# Patient Record
Sex: Male | Born: 2000 | Hispanic: Yes | Marital: Single | Smoking: Never smoker
Health system: Southern US, Community
[De-identification: ages and names within clinical notes are randomized; demographics above are authoritative.]

---
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
## 2019-01-24 ENCOUNTER — Emergency Department (HOSPITAL_COMMUNITY)
Admission: EM | Admit: 2019-01-24 | Discharge: 2019-01-24 | Disposition: A | Discharge status: Home / Self Care | Payer: Self-pay | Attending: Emergency Medicine | Admitting: Emergency Medicine

## 2019-01-24 ENCOUNTER — Encounter (HOSPITAL_COMMUNITY): Payer: Self-pay | Admitting: *Deleted

## 2019-01-24 ENCOUNTER — Emergency Department (HOSPITAL_COMMUNITY): Payer: Self-pay

## 2019-01-24 ENCOUNTER — Other Ambulatory Visit: Payer: Self-pay

## 2019-01-24 DIAGNOSIS — Y999 Unspecified external cause status: Secondary | ICD-10-CM | POA: Insufficient documentation

## 2019-01-24 DIAGNOSIS — Y9351 Activity, roller skating (inline) and skateboarding: Secondary | ICD-10-CM | POA: Insufficient documentation

## 2019-01-24 DIAGNOSIS — S62521A Displaced fracture of distal phalanx of right thumb, initial encounter for closed fracture: Secondary | ICD-10-CM | POA: Insufficient documentation

## 2019-01-24 DIAGNOSIS — S60111A Contusion of right thumb with damage to nail, initial encounter: Secondary | ICD-10-CM | POA: Insufficient documentation

## 2019-01-24 DIAGNOSIS — Y929 Unspecified place or not applicable: Secondary | ICD-10-CM | POA: Insufficient documentation

## 2019-01-24 MED ORDER — CEPHALEXIN 500 MG PO CAPS: 500.0000 mg | ORAL_CAPSULE | Freq: Two times a day (BID) | ORAL | 0 refills | Status: DC

## 2019-01-24 NOTE — ED Notes (Signed)
Declined W/C at D/C and was escorted to lobby by RN. 

## 2019-01-24 NOTE — Discharge Instructions (Addendum)
Please read attached information. If you experience any new or worsening signs or symptoms please return to the emergency room for evaluation. Please follow-up with your primary care provider or specialist as discussed. Please use medication prescribed only as directed and discontinue taking if you have any concerning signs or symptoms.   °

## 2019-01-24 NOTE — ED Notes (Signed)
Patient transported to X-ray 

## 2019-01-24 NOTE — ED Triage Notes (Signed)
Pt reports he fell last night while skate boarding. Pt's RT thumb is swollen and nail bed is dark in color.

## 2019-01-24 NOTE — ED Provider Notes (Signed)
[facility] Provider Note   CSN: [id number] Arrival date & time: 01/24/19  0940     History   Chief Complaint Chief Complaint  Patient presents with  . Finger Injury    HPI [name] is a 18 y.o. male.  HPI   18 year old right-hand-dominant male presents today with complaints of right thumb pain.  Patient notes he was skateboarding and fell causing immediate pain to the distal phalanx of the right thumb.  He notes bruising along the area and decreased range of motion at the DIP, remainder of thumb and hand atraumatic no other significant injuries noted.  Patient has not had any food or drink today, he notes taking a Tylenol PM last night.    History reviewed. No pertinent past medical history.     There are no active problems to display for this patient.   History reviewed. No pertinent surgical history.      Home Medications    Prior to Admission medications   Medication Sig Start Date End Date Taking? Authorizing Provider  cephALEXin (KEFLEX) 500 MG capsule Take 1 capsule (500 mg total) by mouth 2 (two) times daily. 01/24/19   Lorriane Dehart, Tinnie GensJeffrey, PA-C  ibuprofen (CHILDRENS MOTRIN) 100 MG/5ML suspension Take 20 mLs (400 mg total) by mouth every 6 (six) hours as needed. 12/29/14   Piepenbrink, Victorino DikeJennifer, PA-C  naproxen (NAPROSYN) 500 MG tablet Take 1 tablet (500 mg total) by mouth 2 (two) times daily as needed for mild pain, moderate pain or headache (TAKE WITH MEALS.). 07/15/14   Street, MoroniMercedes, PA-C  oxyCODONE-acetaminophen (PERCOCET) 5-325 MG per tablet Take 1-2 tablets by mouth every 6 (six) hours as needed for severe pain. 07/15/14   Street, BryantownMercedes, PA-C    Family History History reviewed. No pertinent family history.  Social History Social History   Tobacco Use  . Smoking status: Never Smoker  . Smokeless tobacco: Never Used  Substance Use Topics  . Alcohol use: No  . Drug use: No     Allergies     Patient has no known allergies.   Review of Systems Review of Systems  All other systems reviewed and are negative.    Physical Exam Updated Vital Signs BP 124/75 (BP Location: Right Arm)   Pulse (!) 56   Temp 98 F (36.7 C) (Oral)   Resp 16   Ht 5\' 7"  (1.702 m)   Wt 68 kg   SpO2 100%   BMI 23.49 kg/m   Physical Exam Vitals signs and nursing note reviewed.  Constitutional:      Appearance: He is well-developed.  HENT:     Head: Normocephalic and atraumatic.  Eyes:     General: No scleral icterus.       Right eye: No discharge.        Left eye: No discharge.     Conjunctiva/sclera: Conjunctivae normal.     Pupils: Pupils are equal, round, and reactive to light.  Neck:     Musculoskeletal: Normal range of motion.     Vascular: No JVD.     Trachea: No tracheal deviation.  Pulmonary:     Effort: Pulmonary effort is normal.     Breath sounds: No stridor.  Musculoskeletal:     Comments: Bruising noted to the right distal first phalanx with subungual hematoma decreased flexion noted at the DIP remainder of hand nontender atraumatic cap refill intact to the distal thumb  Neurological:     Mental Status: He is  alert and oriented to person, place, and time.     Coordination: Coordination normal.  Psychiatric:        Behavior: Behavior normal.        Thought Content: Thought content normal.        Judgment: Judgment normal.      ED Treatments / Results  Labs (all labs ordered are listed, but only abnormal results are displayed) Labs Reviewed - No data to display  EKG None  Radiology Dg Finger Thumb Right  Result Date: 01/24/2019 CLINICAL DATA:  Trauma to distal phalanx, bruising. EXAM: RIGHT THUMB 2+V COMPARISON:  None. FINDINGS: Slightly displaced fracture at the base of the distal phalanx of the RIGHT thumb, with extension to the articular surface. Alignment at the IP joint remains normal. Proximal phalanx appears intact and normally aligned. IMPRESSION:  Slightly displaced fracture at the base of the distal phalanx of the RIGHT thumb, with extension to the articular surface. Electronically Signed   By: Bary RichardStan  Maynard M.D.   On: 01/24/2019 10:46    Procedures Procedures (including critical care time)  Subungual hematoma drained with cautery pen no complications    Medications Ordered in ED Medications - No data to display   Initial Impression / Assessment and Plan / ED Course  I have reviewed the triage vital signs and the nursing notes.  Pertinent labs & imaging results that were available during my care of the patient were reviewed by me and considered in my medical decision making (see chart for details).     Labs:   Imaging: DG thumb right  Consults: Dr. Mina MarbleWeingold  Therapeutics:  Discharge Meds: Keflex  Assessment/Plan: 18 year old male presents today with fracture of the distal phalanx.  Discussed case with on-call surgeon Dr. Mina MarbleWeingold who recommends splinting with outpatient follow-up.  He also recommends antibiotics after subungual hematoma drainage.   Final Clinical Impressions(s) / ED Diagnoses   Final diagnoses:  Closed displaced fracture of distal phalanx of right thumb, initial encounter    ED Discharge Orders         Ordered    cephALEXin (KEFLEX) 500 MG capsule  2 times daily     01/24/19 1205           Eyvonne MechanicHedges, Enes Rokosz, PA-C 01/24/19 1211    Melene PlanFloyd, Dan, DO 01/24/19 1435

## 2019-03-19 ENCOUNTER — Ambulatory Visit
Admission: EM | Admit: 2019-03-19 | Discharge: 2019-03-19 | Disposition: A | Discharge status: Home / Self Care | Payer: Medicaid Other | Attending: Physician Assistant | Admitting: Physician Assistant

## 2019-03-19 DIAGNOSIS — H6123 Impacted cerumen, bilateral: Secondary | ICD-10-CM

## 2019-03-19 MED ORDER — FLUTICASONE PROPIONATE 50 MCG/ACT NA SUSP: 2.0000 | Freq: Every day | NASAL | 0 refills | Status: DC

## 2019-03-19 NOTE — Discharge Instructions (Signed)
Ear wax removed today. If experiencing continued muffled hearing, can fill flonase and use as directed for 2 weeks. Otherwise, recheck as needed.

## 2019-03-19 NOTE — ED Triage Notes (Signed)
Pt c/o rt ear fullness since yesterday, denies pain

## 2019-03-19 NOTE — ED Provider Notes (Signed)
[facility]    CSN: [id number] Arrival date & time: 03/19/19  1355     History   Chief Complaint Chief Complaint  Patient presents with  . Ear Fullness    HPI James Shields is a 18 y.o. male.   18 year old male comes in for 2-day history of right ear fullness.  Denies pain, drainage. Denies rhinorrhea, nasal congestion, cough.  Denies fever, chills, night sweats.  Denies recent swimming, foreign body in ear.  States felt fullness, try to use a cotton swab without relief.      History reviewed. No pertinent past medical history.  There are no active problems to display for this patient.   History reviewed. No pertinent surgical history.     Home Medications    Prior to Admission medications   Medication Sig Start Date End Date Taking? Authorizing Provider  fluticasone (FLONASE) 50 MCG/ACT nasal spray Place 2 sprays into both nostrils daily. 03/19/19   Cathie Hoops, Amy V, PA-C  ibuprofen (CHILDRENS MOTRIN) 100 MG/5ML suspension Take 20 mLs (400 mg total) by mouth every 6 (six) hours as needed. 12/29/14   Piepenbrink, Victorino Dike, PA-C  naproxen (NAPROSYN) 500 MG tablet Take 1 tablet (500 mg total) by mouth 2 (two) times daily as needed for mild pain, moderate pain or headache (TAKE WITH MEALS.). 07/15/14   Street, [place], PA-C    Family History No family history on file.  Social History Social History   Tobacco Use  . Smoking status: Never Smoker  . Smokeless tobacco: Never Used  Substance Use Topics  . Alcohol use: No  . Drug use: No     Allergies   Patient has no known allergies.   Review of Systems Review of Systems  Reason unable to perform ROS: See HPI as above.     Physical Exam Triage Vital Signs ED Triage Vitals [03/19/19 1402]  Enc Vitals Group     BP 109/70     Pulse Rate 69     Resp 16     Temp 98 F (36.7 C)     Temp Source Oral     SpO2 97 %     Weight      Height      Head Circumference      Peak Flow      Pain  Score 0     Pain Loc      Pain Edu?      Excl. in GC?    No data found.  Updated Vital Signs BP 109/70 (BP Location: Left Arm)   Pulse 69   Temp 98 F (36.7 C) (Oral)   Resp 16   SpO2 97%   Physical Exam Constitutional:      General: He is not in acute distress.    Appearance: He is well-developed. He is not ill-appearing, toxic-appearing or diaphoretic.  HENT:     Head: Normocephalic and atraumatic.     Ears:     Comments: No tenderness to palpation of bilateral tragus.  Bilateral cerumen impaction, TM not visible.  Post ear irrigation, no ear canal swelling.  Right ear TM opaque and dull.  No erythema, bulging.  Left ear TM normal, pearly gray.    Nose: Nose normal. No congestion or rhinorrhea.     Mouth/Throat:     Mouth: Mucous membranes are moist.     Pharynx: Oropharynx is clear. Uvula midline.  Eyes:     Conjunctiva/sclera: Conjunctivae normal.     Pupils: Pupils are  equal, round, and reactive to light.  Neurological:     Mental Status: He is alert and oriented to person, place, and time.      UC Treatments / Results  Labs (all labs ordered are listed, but only abnormal results are displayed) Labs Reviewed - No data to display  EKG None  Radiology No results found.  Procedures Procedures (including critical care time)  Medications Ordered in UC Medications - No data to display  Initial Impression / Assessment and Plan / UC Course  I have reviewed the triage vital signs and the nursing notes.  Pertinent labs & imaging results that were available during my care of the patient were reviewed by me and considered in my medical decision making (see chart for details).    Patient with significant improvement of symptoms after ear irrigation.  Still has mild muffled hearing to the right ear.  Will provide Flonase for possible eustachian tube dysfunction.  Return precautions given.  Patient expresses understanding and agrees to plan.  Final Clinical  Impressions(s) / UC Diagnoses   Final diagnoses:  Bilateral impacted cerumen    ED Prescriptions    Medication Sig Dispense Auth. Provider   fluticasone (FLONASE) 50 MCG/ACT nasal spray Place 2 sprays into both nostrils daily. 1 g Threasa Alpha, PA-C 03/19/19 1501

## 2019-08-22 ENCOUNTER — Ambulatory Visit
Admission: EM | Admit: 2019-08-22 | Discharge: 2019-08-22 | Disposition: A | Discharge status: Home / Self Care | Payer: Medicaid Other | Attending: Physician Assistant | Admitting: Physician Assistant

## 2019-08-22 ENCOUNTER — Encounter: Payer: Self-pay | Admitting: Emergency Medicine

## 2019-08-22 ENCOUNTER — Other Ambulatory Visit: Payer: Self-pay

## 2019-08-22 DIAGNOSIS — Z202 Contact with and (suspected) exposure to infections with a predominantly sexual mode of transmission: Secondary | ICD-10-CM

## 2019-08-22 MED ORDER — AZITHROMYCIN 500 MG PO TABS
1000.0000 mg | ORAL_TABLET | Freq: Once | ORAL | Status: AC
  Administered 2019-08-22: 1000 mg via ORAL

## 2019-08-22 NOTE — ED Provider Notes (Signed)
[facility]    CSN: [id number] Arrival date & time: 08/22/19  1250      History   Chief Complaint Chief Complaint  Patient presents with  . Exposure to STD    HPI James Shields is a 18 y.o. male.   18 year old male comes in for STD testing due to exposure. He is asymptomatic. Denies urinary symptoms such as frequency, dysuria, hematuria. Denies abdominal pain, nausea, vomiting. Denies penile discharge, penile lesion, testicular swelling, testicular pain. Sexually active with one male partner, no condom use. States partner was tested positive for chlamydia.     History reviewed. No pertinent past medical history.  There are no active problems to display for this patient.   History reviewed. No pertinent surgical history.     Home Medications    Prior to Admission medications   Medication Sig Start Date End Date Taking? Authorizing Provider  fluticasone (FLONASE) 50 MCG/ACT nasal spray Place 2 sprays into both nostrils daily. 03/19/19   Cathie HoopsYu, Amy V, PA-C  ibuprofen (CHILDRENS MOTRIN) 100 MG/5ML suspension Take 20 mLs (400 mg total) by mouth every 6 (six) hours as needed. 12/29/14   Piepenbrink, Victorino DikeJennifer, PA-C  naproxen (NAPROSYN) 500 MG tablet Take 1 tablet (500 mg total) by mouth 2 (two) times daily as needed for mild pain, moderate pain or headache (TAKE WITH MEALS.). 07/15/14   Street, ButlerMercedes, PA-C    Family History History reviewed. No pertinent family history.  Social History Social History   Tobacco Use  . Smoking status: Never Smoker  . Smokeless tobacco: Never Used  Substance Use Topics  . Alcohol use: No  . Drug use: No     Allergies   Patient has no known allergies.   Review of Systems Review of Systems  Reason unable to perform ROS: See HPI as above.     Physical Exam Triage Vital Signs ED Triage Vitals [08/22/19 1309]  Enc Vitals Group     BP 114/63     Pulse Rate 60     Resp 18     Temp 98.3 F (36.8 C)   Temp Source Oral     SpO2 98 %     Weight      Height      Head Circumference      Peak Flow      Pain Score 0     Pain Loc      Pain Edu?      Excl. in GC?    No data found.  Updated Vital Signs BP 114/63 (BP Location: Right Arm)   Pulse 60   Temp 98.3 F (36.8 C) (Oral)   Resp 18   SpO2 98%   Physical Exam Exam conducted with a chaperone present.  Constitutional:      General: He is not in acute distress.    Appearance: He is well-developed. He is not diaphoretic.  HENT:     Head: Normocephalic and atraumatic.  Eyes:     Conjunctiva/sclera: Conjunctivae normal.     Pupils: Pupils are equal, round, and reactive to light.  Pulmonary:     Effort: Pulmonary effort is normal. No respiratory distress.  Genitourinary:    Penis: Uncircumcised. No discharge or lesions.      Scrotum/Testes: Normal.  Neurological:     Mental Status: He is alert and oriented to person, place, and time.     UC Treatments / Results  Labs (all labs ordered are listed, but only abnormal  results are displayed) Labs Reviewed  CYTOLOGY, (ORAL, ANAL, URETHRAL) ANCILLARY ONLY    EKG   Radiology No results found.  Procedures Procedures (including critical care time)  Medications Ordered in UC Medications  azithromycin (ZITHROMAX) tablet 1,000 mg (1,000 mg Oral Given 08/22/19 1317)    Initial Impression / Assessment and Plan / UC Course  I have reviewed the triage vital signs and the nursing notes.  Pertinent labs & imaging results that were available during my care of the patient were reviewed by me and considered in my medical decision making (see chart for details).    Patient was treated empirically for chlamydia. Azithromycin given in office today. Cytology sent, patient will be contacted with any positive results that require additional treatment. Patient to refrain from sexual activity for the next 7 days. Return precautions given.   Final Clinical Impressions(s) / UC Diagnoses    Final diagnoses:  STD exposure   ED Prescriptions    None       Ok Edwards, PA-C 08/22/19 1345

## 2019-08-22 NOTE — ED Triage Notes (Signed)
Pt here for exposure to chlamydia; denies sx

## 2019-08-22 NOTE — Discharge Instructions (Signed)
You were treated empirically for chlamydia. Azithromycin 1g by mouth given in office today. Cytology sent, you will be contacted with any positive results that requires further treatment. Refrain from sexual activity and alcohol use for the next 7 days. Monitor for worsening symptoms, testicular swelling/pain, follow up for reevaluation needed.

## 2019-08-26 LAB — CYTOLOGY, (ORAL, ANAL, URETHRAL) ANCILLARY ONLY
Chlamydia: NEGATIVE
Neisseria Gonorrhea: NEGATIVE
Trichomonas: NEGATIVE

## 2020-06-16 ENCOUNTER — Other Ambulatory Visit: Payer: Self-pay

## 2020-06-16 ENCOUNTER — Encounter: Payer: Self-pay | Admitting: Emergency Medicine

## 2020-06-16 ENCOUNTER — Ambulatory Visit
Admission: EM | Admit: 2020-06-16 | Discharge: 2020-06-16 | Disposition: A | Discharge status: Home / Self Care | Payer: 59 | Attending: Physician Assistant | Admitting: Physician Assistant

## 2020-06-16 DIAGNOSIS — H9202 Otalgia, left ear: Secondary | ICD-10-CM

## 2020-06-16 MED ORDER — FLUTICASONE PROPIONATE 50 MCG/ACT NA SUSP: 2.0000 | Freq: Every day | NASAL | 0 refills | Status: DC

## 2020-06-16 MED ORDER — AZELASTINE HCL 0.1 % NA SOLN: 2.0000 | Freq: Two times a day (BID) | NASAL | 0 refills | Status: DC

## 2020-06-16 NOTE — ED Triage Notes (Addendum)
Woke with left ear pain that has worsened throughout the day.  Painful with chewing or clenching of teeth  Denies runny nose No sore throat No cough

## 2020-06-16 NOTE — Discharge Instructions (Signed)
Flonase as directed. If symptoms not improving after 2-3 days, can add azelastine as directed. Follow up with ear nose and throat if symptoms not improving.

## 2020-06-16 NOTE — ED Provider Notes (Signed)
[facility]    CSN: [id number] Arrival date & time: 06/16/20  1728      History   Chief Complaint Chief Complaint  Patient presents with  . Otalgia    HPI James Shields is a 19 y.o. male.   19 year old male comes in for 1 day history of left ear pain. Getting worse throughout the day. Denies muffled hearing, ear drainage. Denies URI symptoms, fever. Has gone swimming recently.      History reviewed. No pertinent past medical history.  There are no problems to display for this patient.   History reviewed. No pertinent surgical history.     Home Medications    Prior to Admission medications   Medication Sig Start Date End Date Taking? Authorizing Provider  azelastine (ASTELIN) 0.1 % nasal spray Place 2 sprays into both nostrils 2 (two) times daily. 06/16/20   Cathie Hoops, Haywood Meinders V, PA-C  fluticasone (FLONASE) 50 MCG/ACT nasal spray Place 2 sprays into both nostrils daily. 06/16/20   Belinda Fisher, PA-C    Family History Family History  Problem Relation Age of Onset  . Healthy Mother     Social History Social History   Tobacco Use  . Smoking status: Never Smoker  . Smokeless tobacco: Never Used  Vaping Use  . Vaping Use: Never used  Substance Use Topics  . Alcohol use: No  . Drug use: No     Allergies   Patient has no known allergies.   Review of Systems Review of Systems  Reason unable to perform ROS: See HPI as above.     Physical Exam Triage Vital Signs ED Triage Vitals  Enc Vitals Group     BP 06/16/20 1740 (!) 110/56     Pulse Rate 06/16/20 1740 (!) 47     Resp 06/16/20 1740 16     Temp 06/16/20 1740 98.2 F (36.8 C)     Temp Source 06/16/20 1740 Oral     SpO2 06/16/20 1740 99 %     Weight --      Height --      Head Circumference --      Peak Flow --      Pain Score 06/16/20 1747 10     Pain Loc --      Pain Edu? --      Excl. in GC? --    No data found.  Updated Vital Signs BP (!) 110/56 (BP Location: Left Arm)    Pulse (!) 47   Temp 98.2 F (36.8 C) (Oral)   Resp 16   SpO2 99%   Physical Exam Constitutional:      General: He is not in acute distress.    Appearance: Normal appearance. He is well-developed. He is not toxic-appearing or diaphoretic.  HENT:     Head: Normocephalic and atraumatic.     Right Ear: Tympanic membrane, ear canal and external ear normal. Tympanic membrane is not erythematous or bulging.     Left Ear: Tympanic membrane, ear canal and external ear normal. Tympanic membrane is not erythematous or bulging.     Ears:     Comments: No tenderness to bilateral tragus. Eyes:     Conjunctiva/sclera: Conjunctivae normal.     Pupils: Pupils are equal, round, and reactive to light.  Pulmonary:     Effort: Pulmonary effort is normal. No respiratory distress.     Comments: Speaking in full sentences without difficulty Musculoskeletal:     Cervical back: Normal  range of motion and neck supple.  Skin:    General: Skin is warm and dry.  Neurological:     Mental Status: He is alert and oriented to person, place, and time.      UC Treatments / Results  Labs (all labs ordered are listed, but only abnormal results are displayed) Labs Reviewed - No data to display  EKG   Radiology No results found.  Procedures Procedures (including critical care time)  Medications Ordered in UC Medications - No data to display  Initial Impression / Assessment and Plan / UC Course  I have reviewed the triage vital signs and the nursing notes.  Pertinent labs & imaging results that were available during my care of the patient were reviewed by me and considered in my medical decision making (see chart for details).    Ear exam without otitis media/externa. Discussed possible eustachian tube dysfunction. Will start flonase as directed. Add azelastine if symptoms still not improving. Follow up with ENT for further evaluation if needed. Return precautions given.  Final Clinical  Impressions(s) / UC Diagnoses   Final diagnoses:  Left ear pain   ED Prescriptions    Medication Sig Dispense Auth. Provider   fluticasone (FLONASE) 50 MCG/ACT nasal spray Place 2 sprays into both nostrils daily. 1 g Konni Kesinger V, PA-C   azelastine (ASTELIN) 0.1 % nasal spray Place 2 sprays into both nostrils 2 (two) times daily. 30 mL Ok Edwards, PA-C     PDMP not reviewed this encounter.   Ok Edwards, PA-C 06/16/20 1831

## 2020-07-20 ENCOUNTER — Ambulatory Visit (INDEPENDENT_AMBULATORY_CARE_PROVIDER_SITE_OTHER): Payer: 59

## 2020-07-20 ENCOUNTER — Ambulatory Visit
Admission: EM | Admit: 2020-07-20 | Discharge: 2020-07-20 | Disposition: A | Discharge status: Home / Self Care | Payer: 59 | Attending: Family Medicine | Admitting: Family Medicine

## 2020-07-20 DIAGNOSIS — M25532 Pain in left wrist: Secondary | ICD-10-CM

## 2020-07-20 DIAGNOSIS — S6992XA Unspecified injury of left wrist, hand and finger(s), initial encounter: Secondary | ICD-10-CM | POA: Diagnosis not present

## 2020-07-20 DIAGNOSIS — S63502A Unspecified sprain of left wrist, initial encounter: Secondary | ICD-10-CM

## 2020-07-20 NOTE — Discharge Instructions (Addendum)
Take the ibuprofen as prescribed.  Rest and elevate your hand.  Apply ice packs 2-3 times a day for up to 20 minutes each.  Wear the brace when you are active and for comfort. Wear the brace when you are sleeping  Follow up with your primary care provider or an orthopedist if you symptoms continue or worsen;  Or if you develop new symptoms, such as numbness, tingling, or weakness.

## 2020-07-20 NOTE — ED Provider Notes (Signed)
[facility]   [id number] 07/20/20 Arrival Time: 1926  RD:EYCXK PAIN  SUBJECTIVE: History from: patient. Darald Uzzle Kazee is a 19 y.o. male complains of left wrist pain that began about an hour ago.  Reports that he fell off of his skateboard and caught himself with this left hand. Reports pain since then. Has not taken OTC medications for this. Denies fever, chills, erythema, ecchymosis, effusion, weakness, numbness and tingling, saddle paresthesias, loss of bowel or bladder function.      ROS: As per HPI.  All other pertinent ROS negative.     History reviewed. No pertinent past medical history. History reviewed. No pertinent surgical history. No Known Allergies No current facility-administered medications on file prior to encounter.   No current outpatient medications on file prior to encounter.   Social History   Socioeconomic History  . Marital status: Single    Spouse name: Not on file  . Number of children: Not on file  . Years of education: Not on file  . Highest education level: Not on file  Occupational History  . Not on file  Tobacco Use  . Smoking status: Never Smoker  . Smokeless tobacco: Never Used  Vaping Use  . Vaping Use: Never used  Substance and Sexual Activity  . Alcohol use: No  . Drug use: No  . Sexual activity: Not on file  Other Topics Concern  . Not on file  Social History Narrative   ** Merged History Encounter **       Social Determinants of Health   Financial Resource Strain:   . Difficulty of Paying Living Expenses:   Food Insecurity:   . Worried About Programme researcher, broadcasting/film/video in the Last Year:   . Barista in the Last Year:   Transportation Needs:   . Freight forwarder (Medical):   Marland Kitchen Lack of Transportation (Non-Medical):   Physical Activity:   . Days of Exercise per Week:   . Minutes of Exercise per Session:   Stress:   . Feeling of Stress :   Social Connections:   . Frequency of Communication with  Friends and Family:   . Frequency of Social Gatherings with Friends and Family:   . Attends Religious Services:   . Active Member of Clubs or Organizations:   . Attends Banker Meetings:   Marland Kitchen Marital Status:   Intimate Partner Violence:   . Fear of Current or Ex-Partner:   . Emotionally Abused:   Marland Kitchen Physically Abused:   . Sexually Abused:    Family History  Problem Relation Age of Onset  . Healthy Mother     OBJECTIVE:  Vitals:   07/20/20 1932  BP: 99/62  Pulse: 64  Resp: 18  Temp: 98.5 F (36.9 C)  TempSrc: Oral  SpO2: 98%    General appearance: ALERT; in no acute distress.  Head: NCAT Lungs: Normal respiratory effort CV: radial pulses 2+ bilaterally. Cap refill < 2 seconds Musculoskeletal:  Inspection: Skin warm, dry, clear and intact without obvious erythema, effusion, or ecchymosis.  Palpation: left wrist mildly tender to palpation ROM: limited ROM active and passive to left wrist Skin: warm and dry Neurologic: Ambulates without difficulty; Sensation intact about the upper/ lower extremities Psychological: alert and cooperative; normal mood and affect  DIAGNOSTIC STUDIES:  No results found.   ASSESSMENT & PLAN:  1. Left wrist pain   2. Sprain of left wrist, initial encounter    Left wrist xray negative Likely  sprain Brace applied in office Continue conservative management of rest, ice, and gentle stretches Take ibuprofen as needed for pain relief (may cause abdominal discomfort, ulcers, and GI bleeds avoid taking with other NSAIDs)  Follow up with PCP if symptoms persist Return or go to the ER if you have any new or worsening symptoms (fever, chills, chest pain, abdominal pain, changes in bowel or bladder habits, pain radiating into lower legs)   Reviewed expectations re: course of current medical issues. Questions answered. Outlined signs and symptoms indicating need for more acute intervention. Patient verbalized understanding. After  Visit Summary given.       Moshe Cipro, NP 07/22/20 1752

## 2020-07-20 NOTE — ED Triage Notes (Signed)
Pt states fell off his skate board about an hour ago, catching his fall with lt hand. Pt c/o lt wrist pain.

## 2020-11-28 ENCOUNTER — Other Ambulatory Visit: Payer: Self-pay

## 2020-11-28 ENCOUNTER — Ambulatory Visit
Admission: EM | Admit: 2020-11-28 | Discharge: 2020-11-28 | Disposition: A | Discharge status: Home / Self Care | Payer: 59 | Attending: Family Medicine | Admitting: Family Medicine

## 2020-11-28 ENCOUNTER — Ambulatory Visit (INDEPENDENT_AMBULATORY_CARE_PROVIDER_SITE_OTHER): Payer: 59

## 2020-11-28 DIAGNOSIS — W19XXXA Unspecified fall, initial encounter: Secondary | ICD-10-CM | POA: Diagnosis not present

## 2020-11-28 DIAGNOSIS — M25531 Pain in right wrist: Secondary | ICD-10-CM | POA: Diagnosis not present

## 2020-11-28 DIAGNOSIS — S62024A Nondisplaced fracture of middle third of navicular [scaphoid] bone of right wrist, initial encounter for closed fracture: Secondary | ICD-10-CM | POA: Diagnosis not present

## 2020-11-28 NOTE — ED Provider Notes (Signed)
[facility]    CSN: [id number] Arrival date & time: 11/28/20  1023      History   Chief Complaint Chief Complaint  Patient presents with  . Wrist Injury    HPI James Shields is a 19 y.o. male.   Established [facility] patient  Pt reports injury to right wrist from skateboarding yesterday-wearing a velcro splint upon arrival-NAD-steady gait     History reviewed. No pertinent past medical history.  There are no problems to display for this patient.   History reviewed. No pertinent surgical history.     Home Medications    Prior to Admission medications   Not on File    Family History Family History  Problem Relation Age of Onset  . Healthy Mother     Social History Social History   Tobacco Use  . Smoking status: Never Smoker  . Smokeless tobacco: Never Used  Vaping Use  . Vaping Use: Every day  Substance Use Topics  . Alcohol use: No  . Drug use: No     Allergies   Patient has no known allergies.   Review of Systems Review of Systems  Musculoskeletal: Positive for joint swelling.  All other systems reviewed and are negative.    Physical Exam Triage Vital Signs ED Triage Vitals  Enc Vitals Group     BP 11/28/20 1033 118/70     Pulse Rate 11/28/20 1033 83     Resp 11/28/20 1033 18     Temp 11/28/20 1033 98.4 F (36.9 C)     Temp Source 11/28/20 1033 Oral     SpO2 11/28/20 1033 98 %     Weight 11/28/20 1032 140 lb (63.5 kg)     Height 11/28/20 1032 5\' 7"  (1.702 m)     Head Circumference --      Peak Flow --      Pain Score 11/28/20 1031 7     Pain Loc --      Pain Edu? --      Excl. in GC? --    No data found.  Updated Vital Signs BP 118/70 (BP Location: Right Arm)   Pulse 83   Temp 98.4 F (36.9 C) (Oral)   Resp 18   Ht 5\' 7"  (1.702 m)   Wt 63.5 kg   SpO2 98%   BMI 21.93 kg/m    Physical Exam Vitals and nursing note reviewed.  Constitutional:      General: He is not in acute distress.     Appearance: Normal appearance. He is normal weight. He is not ill-appearing.  Musculoskeletal:        General: Swelling, tenderness and signs of injury present.     Cervical back: Normal range of motion and neck supple.     Comments: Swollen right distal radius with tenderness diffusely  Cannot move right wrist without pain  Skin:    General: Skin is warm and dry.  Neurological:     General: No focal deficit present.     Mental Status: He is alert and oriented to person, place, and time.  Psychiatric:        Mood and Affect: Mood normal.        Behavior: Behavior normal.        Thought Content: Thought content normal.        Judgment: Judgment normal.      UC Treatments / Results  Labs (all labs ordered are listed, but only abnormal results are displayed)  Labs Reviewed - No data to display  EKG   Radiology DG Wrist Complete Right  Result Date: 11/28/2020 CLINICAL DATA:  Right wrist pain after fall. EXAM: RIGHT WRIST - COMPLETE 3+ VIEW COMPARISON:  None. FINDINGS: Nondisplaced fracture is seen involving the scaphoid. No other bony abnormality is noted. Joint spaces are intact. No soft tissue abnormality is noted. IMPRESSION: Nondisplaced scaphoid fracture. Electronically Signed   By: Lupita Raider M.D.   On: 11/28/2020 11:52    Procedures Procedures (including critical care time)  Medications Ordered in UC Medications - No data to display  Initial Impression / Assessment and Plan / UC Course  I have reviewed the triage vital signs and the nursing notes.  Pertinent labs & imaging results that were available during my care of the patient were reviewed by me and considered in my medical decision making (see chart for details).    Final Clinical Impressions(s) / UC Diagnoses   Final diagnoses:  Closed nondisplaced fracture of middle third of scaphoid bone of right wrist, initial encounter     Discharge Instructions     This needs orthopedic attention.  Keep the  splint on at all times until seen by orthopedics.  This bone is called a navicular bone or scaphoid bone.    ED Prescriptions    None     I have reviewed the PDMP during this encounter.   Elvina Sidle, MD 11/28/20 1155

## 2020-11-28 NOTE — ED Triage Notes (Signed)
Pt reports injury to right wrist from skateboarding yesterday-wearing a velcro splint upon arrival-NAD-steady gait

## 2020-11-28 NOTE — Discharge Instructions (Addendum)
This needs orthopedic attention.  Keep the splint on at all times until seen by orthopedics.  This bone is called a navicular bone or scaphoid bone.

## 2020-12-03 IMAGING — DX DG WRIST COMPLETE 3+V*R*
4 series · 4 of 4 positions shown · non-contrast
Comparison: None.

CLINICAL DATA: Right wrist pain after fall.

EXAM:
RIGHT WRIST - COMPLETE 3+ VIEW

[wrist pa (1 of 3)]
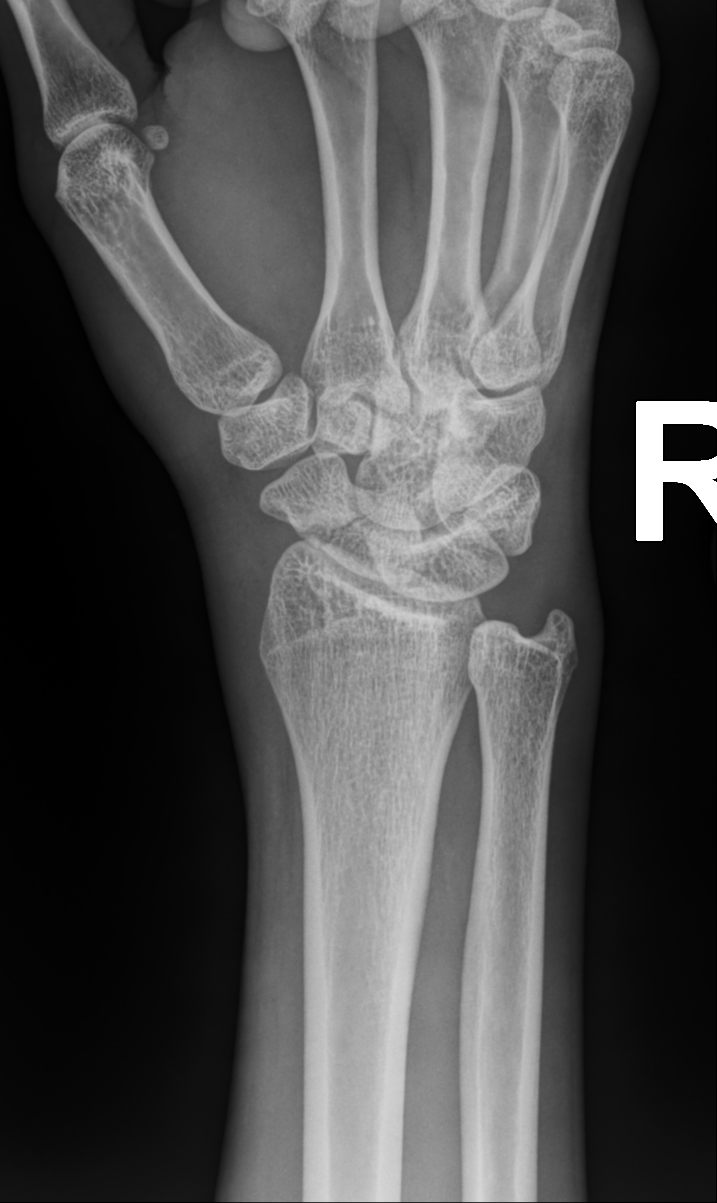

[wrist pa (2 of 3)]
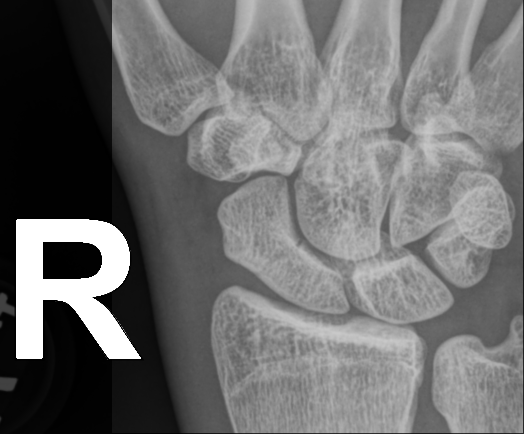

[wrist pa (3 of 3)]
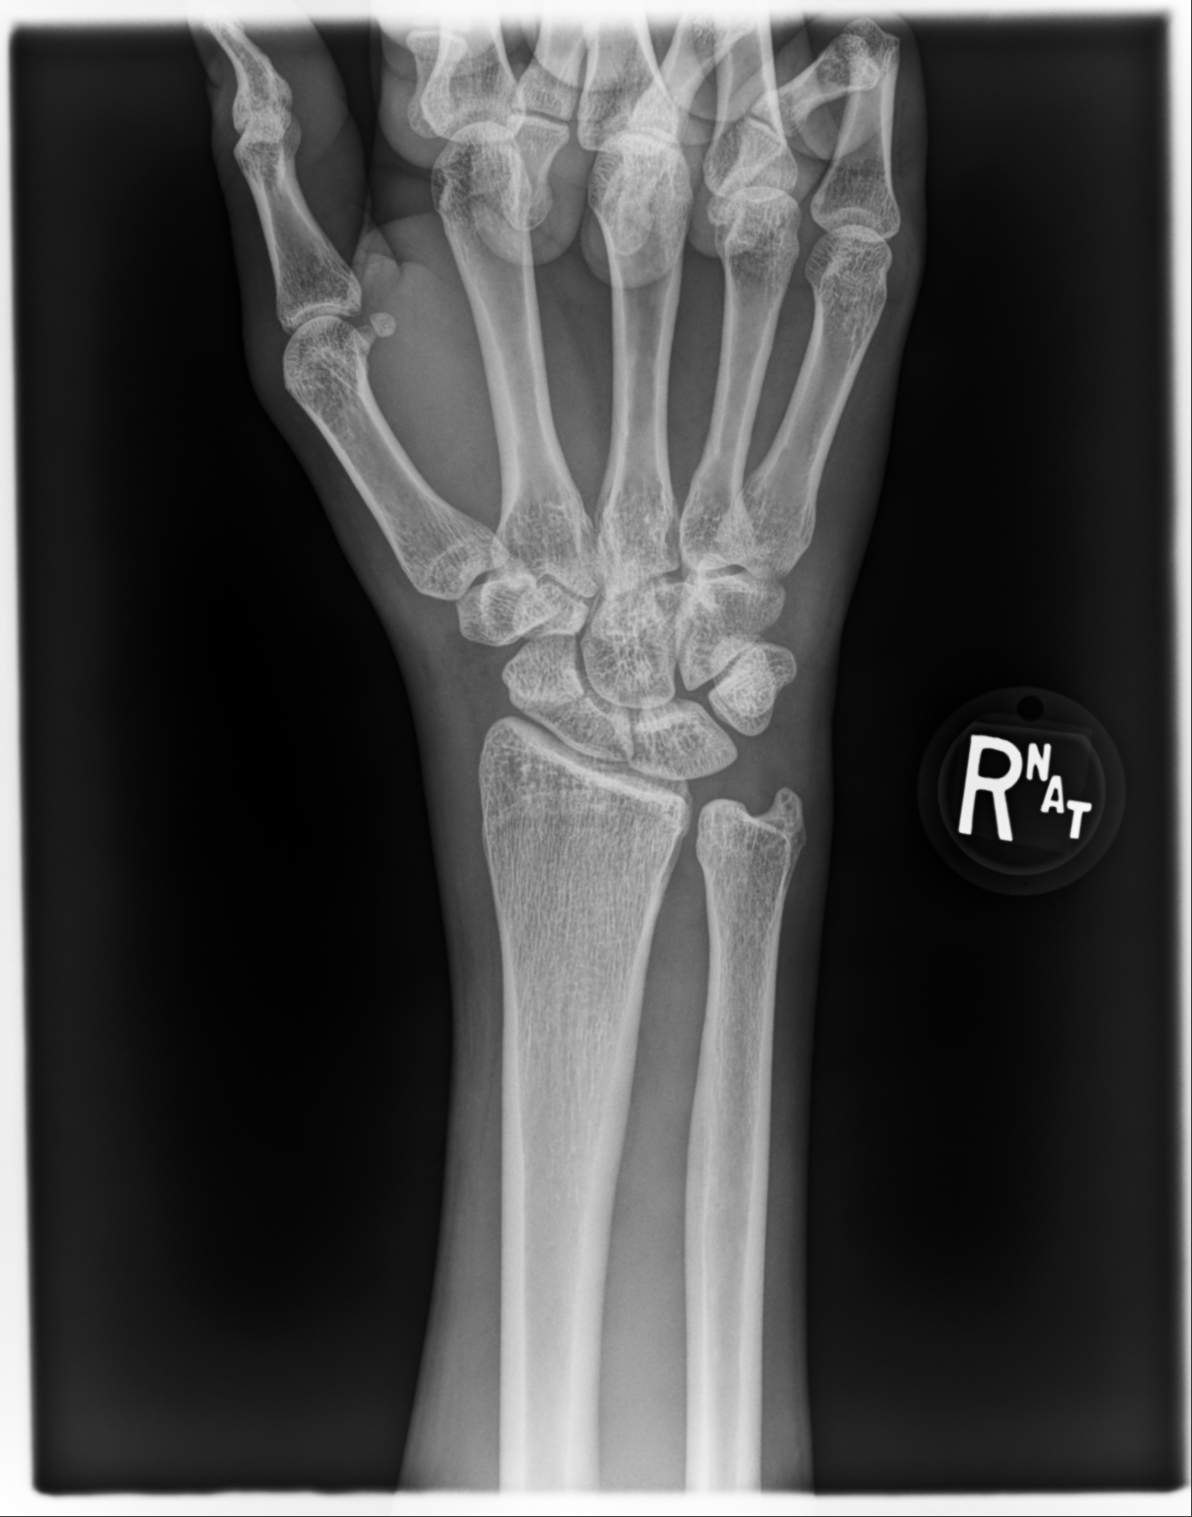

[wrist lat]
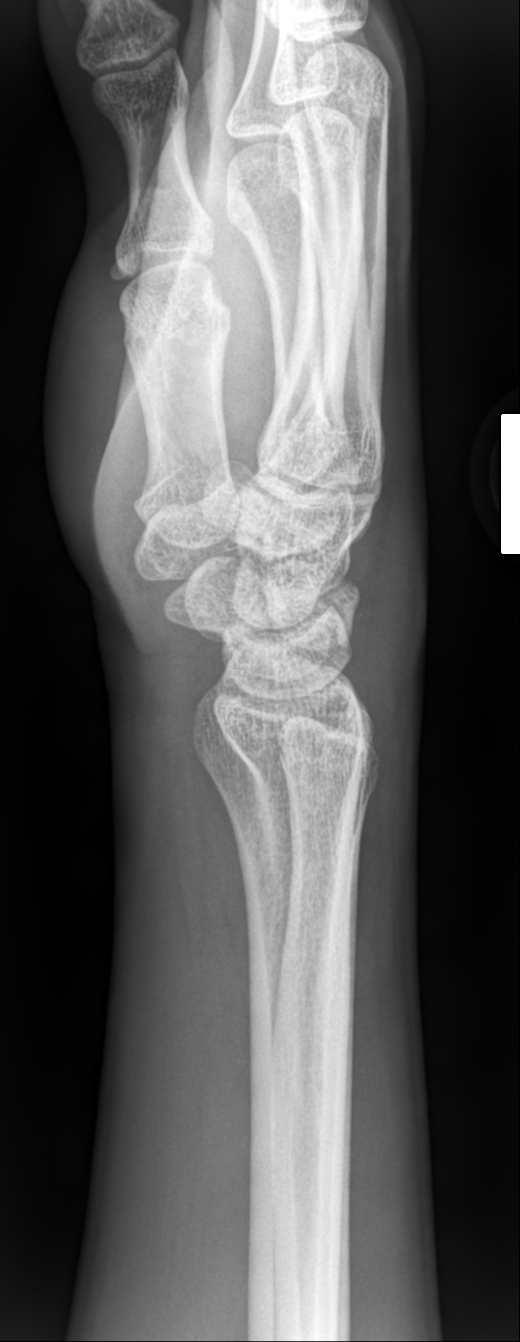

[4 of 4 positions shown; findings below may reference images not displayed]

FINDINGS: Nondisplaced fracture is seen involving the scaphoid. No other bony
abnormality is noted. Joint spaces are intact. No soft tissue
abnormality is noted.
IMPRESSION: Nondisplaced scaphoid fracture.

## 2020-12-14 ENCOUNTER — Ambulatory Visit
Admission: EM | Admit: 2020-12-14 | Discharge: 2020-12-14 | Disposition: A | Discharge status: Home / Self Care | Payer: 59 | Attending: Emergency Medicine | Admitting: Emergency Medicine

## 2020-12-14 DIAGNOSIS — M545 Low back pain, unspecified: Secondary | ICD-10-CM

## 2020-12-14 MED ORDER — PREDNISONE 10 MG PO TABS: ORAL_TABLET | ORAL | 0 refills | Status: DC

## 2020-12-14 MED ORDER — TIZANIDINE HCL 4 MG PO TABS: 4.0000 mg | ORAL_TABLET | Freq: Four times a day (QID) | ORAL | 0 refills | Status: DC | PRN

## 2020-12-14 NOTE — ED Provider Notes (Signed)
[facility]    CSN: [id number] Arrival date & time: 12/14/20  0950      History   Chief Complaint Chief Complaint  Patient presents with  . Back Pain    HPI James Shields is a 19 y.o. male presenting today for evaluation of back pain.  Reports last night began to develop discomfort in his lower back.  Symptoms progressed overnight and reports radiation into bilateral lower extremities.  Denies weakness.  Denies any numbness in groin.  Denies any change in urination or bowel movements.  Denies injury.  Denies history of similar.  Took ibuprofen with some relief. HPI  History reviewed. No pertinent past medical history.  There are no problems to display for this patient.   History reviewed. No pertinent surgical history.     Home Medications    Prior to Admission medications   Medication Sig Start Date End Date Taking? Authorizing Provider  predniSONE (DELTASONE) 10 MG tablet Begin with 6 tabs on day 1, 5 tab on day 2, 4 tab on day 3, 3 tab on day 4, 2 tab on day 5, 1 tab on day 6-take with food 12/14/20   Myrle Dues C, PA-C  tiZANidine (ZANAFLEX) 4 MG tablet Take 1 tablet (4 mg total) by mouth every 6 (six) hours as needed for muscle spasms. 12/14/20   Rhyse Loux, Junius Creamer, PA-C    Family History Family History  Problem Relation Age of Onset  . Healthy Mother     Social History Social History   Tobacco Use  . Smoking status: Never Smoker  . Smokeless tobacco: Never Used  Vaping Use  . Vaping Use: Every day  Substance Use Topics  . Alcohol use: No  . Drug use: No     Allergies   Patient has no known allergies.   Review of Systems Review of Systems  Constitutional: Negative for fatigue and fever.  Eyes: Negative for redness, itching and visual disturbance.  Respiratory: Negative for shortness of breath.   Cardiovascular: Negative for chest pain and leg swelling.  Gastrointestinal: Negative for nausea and vomiting.   Musculoskeletal: Positive for back pain and myalgias. Negative for arthralgias.  Skin: Negative for color change, rash and wound.  Neurological: Negative for dizziness, syncope, weakness, light-headedness and headaches.     Physical Exam Triage Vital Signs ED Triage Vitals  Enc Vitals Group     BP      Pulse      Resp      Temp      Temp src      SpO2      Weight      Height      Head Circumference      Peak Flow      Pain Score      Pain Loc      Pain Edu?      Excl. in GC?    No data found.  Updated Vital Signs BP 118/76 (BP Location: Left Arm)   Pulse 74   Temp 98.5 F (36.9 C) (Oral)   Resp 18   SpO2 98%   Visual Acuity Right Eye Distance:   Left Eye Distance:   Bilateral Distance:    Right Eye Near:   Left Eye Near:    Bilateral Near:     Physical Exam Vitals and nursing note reviewed.  Constitutional:      Appearance: He is well-developed and well-nourished.     Comments: No acute distress  HENT:     Head: Normocephalic and atraumatic.     Nose: Nose normal.  Eyes:     Conjunctiva/sclera: Conjunctivae normal.  Cardiovascular:     Rate and Rhythm: Normal rate.  Pulmonary:     Effort: Pulmonary effort is normal. No respiratory distress.  Abdominal:     General: There is no distension.  Musculoskeletal:        General: Normal range of motion.     Cervical back: Neck supple.     Comments: Diffuse tenderness throughout lumbar spine midline and bilateral lumbar musculature Strength at hips and knees 5/5 and equal bilaterally, patellar reflex 1+ bilaterally  Skin:    General: Skin is warm and dry.  Neurological:     Mental Status: He is alert and oriented to person, place, and time.  Psychiatric:        Mood and Affect: Mood and affect normal.      UC Treatments / Results  Labs (all labs ordered are listed, but only abnormal results are displayed) Labs Reviewed - No data to display  EKG   Radiology No results  found.  Procedures Procedures (including critical care time)  Medications Ordered in UC Medications - No data to display  Initial Impression / Assessment and Plan / UC Course  I have reviewed the triage vital signs and the nursing notes.  Pertinent labs & imaging results that were available during my care of the patient were reviewed by me and considered in my medical decision making (see chart for details).     Low back pain with radicular distribution, offered Toradol, patient declined, will initiate on prednisone taper and supplement with muscle relaxers.  Discussed activity modification.  Continue to monitor.  No red flags for cauda equina.  Discussed strict return precautions. Patient verbalized understanding and is agreeable with plan.  Final Clinical Impressions(s) / UC Diagnoses   Final diagnoses:  Acute bilateral low back pain without sciatica     Discharge Instructions     Prednisone taper over the next 6 days-begin with 6 tablets on day 1, decrease by 1 tablet each day until complete-6, 5, 4, 3, 2, 1-take with food Supplement tizanidine which is a muscle relaxer as needed at home at bedtime, do not drive or work after taking Alternate ice and heat to back, avoid bedrest Follow-up if not improving or worsening Go to emergency room if developing leg weakness, issues controlling urination/bowel movements or numbness in groin    ED Prescriptions    Medication Sig Dispense Auth. Provider   predniSONE (DELTASONE) 10 MG tablet Begin with 6 tabs on day 1, 5 tab on day 2, 4 tab on day 3, 3 tab on day 4, 2 tab on day 5, 1 tab on day 6-take with food 21 tablet Jerol Rufener C, PA-C   tiZANidine (ZANAFLEX) 4 MG tablet Take 1 tablet (4 mg total) by mouth every 6 (six) hours as needed for muscle spasms. 30 tablet Shavanna Furnari, [place] C, PA-C     PDMP not reviewed this encounter.   Lew Dawes, [place] 12/14/20 1136

## 2020-12-14 NOTE — ED Triage Notes (Signed)
Pt states was awaken at 3am with lower back pain radiating down both legs and a headache. Denies injury.

## 2020-12-14 NOTE — Discharge Instructions (Signed)
Prednisone taper over the next 6 days-begin with 6 tablets on day 1, decrease by 1 tablet each day until complete-6, 5, 4, 3, 2, 1-take with food Supplement tizanidine which is a muscle relaxer as needed at home at bedtime, do not drive or work after taking Alternate ice and heat to back, avoid bedrest Follow-up if not improving or worsening Go to emergency room if developing leg weakness, issues controlling urination/bowel movements or numbness in groin

## 2021-11-18 ENCOUNTER — Encounter (HOSPITAL_COMMUNITY): Payer: Self-pay | Admitting: Emergency Medicine

## 2021-11-18 ENCOUNTER — Ambulatory Visit (HOSPITAL_COMMUNITY)
Admission: EM | Admit: 2021-11-18 | Discharge: 2021-11-18 | Disposition: A | Discharge status: Home / Self Care | Payer: 59 | Attending: Physician Assistant | Admitting: Physician Assistant

## 2021-11-18 ENCOUNTER — Other Ambulatory Visit: Payer: Self-pay

## 2021-11-18 ENCOUNTER — Ambulatory Visit (INDEPENDENT_AMBULATORY_CARE_PROVIDER_SITE_OTHER): Payer: 59

## 2021-11-18 DIAGNOSIS — S93401A Sprain of unspecified ligament of right ankle, initial encounter: Secondary | ICD-10-CM

## 2021-11-18 DIAGNOSIS — M25571 Pain in right ankle and joints of right foot: Secondary | ICD-10-CM | POA: Diagnosis not present

## 2021-11-18 MED ORDER — NAPROXEN 500 MG PO TABS: 500.0000 mg | ORAL_TABLET | Freq: Two times a day (BID) | ORAL | 0 refills | Status: AC

## 2021-11-18 NOTE — Discharge Instructions (Signed)
Your x-ray was normal.  I suspect you have a significant strain.  Given you having difficulty putting weight on it I am going to put you in a cam boot.  Continue using crutches as needed and start bearing weight as tolerated.  Use Naprosyn for pain.  Do not take NSAIDs including aspirin, ibuprofen/Advil, naproxen/Aleve as this can cause stomach bleeding.  You can use Tylenol for breakthrough pain.  Use rest, ice, compression, elevation for additional symptom relief.  If symptoms or not improving over the weekend please follow-up with orthopedics as we discussed.

## 2021-11-18 NOTE — ED Provider Notes (Signed)
[facility]    CSN: [id number] Arrival date & time: 11/18/21  1505      History   Chief Complaint Chief Complaint  Patient presents with   Ankle Pain    Right     HPI James Shields is a 20 y.o. male.   Patient presents today with a 2-day history of right ankle pain.  Reports that he was skateboarding when he fell causing sudden inversion of right ankle.  Since that time he has had lateral right ankle pain with radiation into dorsum of foot.  He denies any pain at rest but reports with attempted ambulation this increases to 9/10 and is described as sharp.  He has been using Tylenol, wrap, ice without improvement of symptoms.  He reports previous injuries to ankle but denies any previous surgery.  Denies any numbness or paresthesias.  He has been unable to bear weight since injury and has been ambulating with crutches.   History reviewed. No pertinent past medical history.  There are no problems to display for this patient.   History reviewed. No pertinent surgical history.     Home Medications    Prior to Admission medications   Medication Sig Start Date End Date Taking? Authorizing Provider  naproxen (NAPROSYN) 500 MG tablet Take 1 tablet (500 mg total) by mouth 2 (two) times daily. 11/18/21  Yes Oren Barella, Noberto Retort, PA-C    Family History Family History  Problem Relation Age of Onset   Healthy Mother     Social History Social History   Tobacco Use   Smoking status: Never   Smokeless tobacco: Never  Vaping Use   Vaping Use: Every day  Substance Use Topics   Alcohol use: No   Drug use: No     Allergies   Patient has no known allergies.   Review of Systems Review of Systems  Constitutional:  Positive for activity change. Negative for appetite change, fatigue and fever.  Respiratory:  Negative for cough and shortness of breath.   Cardiovascular:  Negative for chest pain.  Musculoskeletal:  Positive for arthralgias, gait problem and  joint swelling. Negative for myalgias.  Skin:  Negative for color change and wound.  Neurological:  Negative for dizziness, weakness, light-headedness, numbness and headaches.    Physical Exam Triage Vital Signs ED Triage Vitals  Enc Vitals Group     BP 11/18/21 1651 118/70     Pulse Rate 11/18/21 1651 (!) 58     Resp 11/18/21 1651 18     Temp 11/18/21 1651 97.7 F (36.5 C)     Temp src --      SpO2 11/18/21 1651 98 %     Weight --      Height --      Head Circumference --      Peak Flow --      Pain Score 11/18/21 1650 0     Pain Loc --      Pain Edu? --      Excl. in GC? --    No data found.  Updated Vital Signs BP 118/70 (BP Location: Right Arm)   Pulse (!) 58   Temp 97.7 F (36.5 C)   Resp 18   SpO2 98%   Visual Acuity Right Eye Distance:   Left Eye Distance:   Bilateral Distance:    Right Eye Near:   Left Eye Near:    Bilateral Near:     Physical Exam Vitals reviewed.  Constitutional:  General: He is awake.     Appearance: Normal appearance. He is well-developed. He is not ill-appearing.     Comments: Very pleasant male appears stated age no acute distress sitting comfortably on exam room table  HENT:     Head: Normocephalic and atraumatic.  Cardiovascular:     Rate and Rhythm: Normal rate and regular rhythm.     Pulses:          Posterior tibial pulses are 2+ on the right side and 2+ on the left side.     Heart sounds: Normal heart sounds, S1 normal and S2 normal. No murmur heard.    Comments: Capillary refill within 2 seconds bilateral toes Pulmonary:     Effort: Pulmonary effort is normal.     Breath sounds: Normal breath sounds. No stridor. No wheezing, rhonchi or rales.     Comments: Clear to auscultation bilaterally Musculoskeletal:     Right ankle: Swelling present. Tenderness present over the lateral malleolus. Decreased range of motion.     Comments: Right ankle: Swelling noted at lateral malleolus.  Significant tenderness palpation.   Decreased range of motion at right ankle.  Foot neurovascularly intact.  Neurological:     Mental Status: He is alert.  Psychiatric:        Behavior: Behavior is cooperative.     UC Treatments / Results  Labs (all labs ordered are listed, but only abnormal results are displayed) Labs Reviewed - No data to display  EKG   Radiology DG Ankle Complete Right  Result Date: 11/18/2021 CLINICAL DATA:  Lateral ankle pain, injured while skateboarding yesterday. EXAM: RIGHT ANKLE - COMPLETE 3+ VIEW COMPARISON:  Right ankle radiographs 09/19/2016 FINDINGS: There is no evidence of fracture, dislocation, or joint effusion. There is no evidence of arthropathy or other focal bone abnormality. Soft tissues are unremarkable. IMPRESSION: Negative. Electronically Signed   By: Emmaline Kluver M.D.   On: 11/18/2021 17:24    Procedures Procedures (including critical care time)  Medications Ordered in UC Medications - No data to display  Initial Impression / Assessment and Plan / UC Course  I have reviewed the triage vital signs and the nursing notes.  Pertinent labs & imaging results that were available during my care of the patient were reviewed by me and considered in my medical decision making (see chart for details).     X-ray obtained showed no osseous abnormality.  Suspect severe strain given ongoing pain and difficulty ambulating.  We will place patient into cam boot for comfort and support.  He has crutches and can use these as needed with recommendation to increase activity as tolerated.  He was prescribed Naprosyn twice daily with instruction to take additional NSAIDs with this medication due to risk of GI bleeding.  Can use Tylenol for breakthrough pain.  Recommended RICE protocol for additional symptom relief.  Discussed that if symptoms or not improving he should follow-up with orthopedic was given contact information for local provider.  Discussed alarm symptoms that warrant emergent  evaluation.  Strict return precautions given to which he expressed understanding.  Final Clinical Impressions(s) / UC Diagnoses   Final diagnoses:  Sprain of right ankle, unspecified ligament, initial encounter  Acute right ankle pain     Discharge Instructions      Your x-ray was normal.  I suspect you have a significant strain.  Given you having difficulty putting weight on it I am going to put you in a cam boot.  Continue using crutches  as needed and start bearing weight as tolerated.  Use Naprosyn for pain.  Do not take NSAIDs including aspirin, ibuprofen/Advil, naproxen/Aleve as this can cause stomach bleeding.  You can use Tylenol for breakthrough pain.  Use rest, ice, compression, elevation for additional symptom relief.  If symptoms or not improving over the weekend please follow-up with orthopedics as we discussed.     ED Prescriptions     Medication Sig Dispense Auth. Provider   naproxen (NAPROSYN) 500 MG tablet Take 1 tablet (500 mg total) by mouth 2 (two) times daily. 30 tablet Armoni Kludt, Noberto Retort, PA-C      PDMP not reviewed this encounter.   Jeani Hawking, PA-C 11/18/21 1803

## 2021-11-18 NOTE — ED Triage Notes (Signed)
Pt is present today with right ankle pain from an skateboard accident. Pt states that it hurts to apply pressure to his ankle.
